# Patient Record
Sex: Female | Born: 1980 | Race: White | Hispanic: No | Marital: Single | Smoking: Current every day smoker
Health system: Southern US, Community
[De-identification: ages and names within clinical notes are randomized; demographics above are authoritative.]

## PROBLEM LIST (undated history)

## (undated) HISTORY — PX: BREAST SURGERY: SHX581

## (undated) HISTORY — PX: TUBAL LIGATION: SHX77

---

## 2012-10-05 ENCOUNTER — Emergency Department (HOSPITAL_COMMUNITY): Payer: Medicaid Other

## 2012-10-05 ENCOUNTER — Emergency Department (HOSPITAL_COMMUNITY)
Admission: EM | Admit: 2012-10-05 | Discharge: 2012-10-05 | Disposition: A | Discharge status: Home / Self Care | Payer: Medicaid Other | Attending: Emergency Medicine | Admitting: Emergency Medicine

## 2012-10-05 ENCOUNTER — Encounter (HOSPITAL_COMMUNITY): Payer: Self-pay | Admitting: Emergency Medicine

## 2012-10-05 DIAGNOSIS — S0993XA Unspecified injury of face, initial encounter: Secondary | ICD-10-CM | POA: Insufficient documentation

## 2012-10-05 DIAGNOSIS — F17200 Nicotine dependence, unspecified, uncomplicated: Secondary | ICD-10-CM | POA: Insufficient documentation

## 2012-10-05 DIAGNOSIS — S0990XA Unspecified injury of head, initial encounter: Secondary | ICD-10-CM

## 2012-10-05 DIAGNOSIS — S060X9A Concussion with loss of consciousness of unspecified duration, initial encounter: Secondary | ICD-10-CM | POA: Insufficient documentation

## 2012-10-05 MED ORDER — OXYCODONE-ACETAMINOPHEN 5-325 MG PO TABS: 1.0000 | ORAL_TABLET | Freq: Once | ORAL | Status: DC

## 2012-10-05 MED ORDER — OXYCODONE-ACETAMINOPHEN 5-325 MG PO TABS: 2.0000 | ORAL_TABLET | Freq: Four times a day (QID) | ORAL | Status: AC | PRN

## 2012-10-05 NOTE — ED Notes (Signed)
Percocet given by previous [employer] fundraiser

## 2012-10-05 NOTE — ED Notes (Signed)
[employer] spoke with pt. at waiting area.
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
## 2014-04-09 ENCOUNTER — Encounter (HOSPITAL_COMMUNITY): Payer: Self-pay | Admitting: Emergency Medicine

## 2014-04-09 ENCOUNTER — Emergency Department (HOSPITAL_COMMUNITY): Payer: Self-pay

## 2014-04-09 ENCOUNTER — Emergency Department (HOSPITAL_COMMUNITY)
Admission: EM | Admit: 2014-04-09 | Discharge: 2014-04-09 | Disposition: A | Discharge status: Home / Self Care | Payer: Self-pay | Attending: Emergency Medicine | Admitting: Emergency Medicine

## 2014-04-09 DIAGNOSIS — R059 Cough, unspecified: Secondary | ICD-10-CM

## 2014-04-09 DIAGNOSIS — Z720 Tobacco use: Secondary | ICD-10-CM | POA: Insufficient documentation

## 2014-04-09 DIAGNOSIS — R6889 Other general symptoms and signs: Secondary | ICD-10-CM

## 2014-04-09 DIAGNOSIS — R05 Cough: Secondary | ICD-10-CM | POA: Insufficient documentation

## 2014-04-09 DIAGNOSIS — Z3202 Encounter for pregnancy test, result negative: Secondary | ICD-10-CM | POA: Insufficient documentation

## 2014-04-09 LAB — CBC WITH DIFFERENTIAL/PLATELET
BASOS ABS: 0 10*3/uL (ref 0.0–0.1)
Basophils Relative: 0 % (ref 0–1)
EOS PCT: 0 % (ref 0–5)
Eosinophils Absolute: 0 10*3/uL (ref 0.0–0.7)
HEMATOCRIT: 45.3 % (ref 36.0–46.0)
HEMOGLOBIN: 15.5 g/dL — AB (ref 12.0–15.0)
LYMPHS ABS: 0.8 10*3/uL (ref 0.7–4.0)
LYMPHS PCT: 10 % — AB (ref 12–46)
MCH: 34.4 pg — AB (ref 26.0–34.0)
MCHC: 34.2 g/dL (ref 30.0–36.0)
MCV: 100.4 fL — AB (ref 78.0–100.0)
MONO ABS: 1.2 10*3/uL — AB (ref 0.1–1.0)
Monocytes Relative: 15 % — ABNORMAL HIGH (ref 3–12)
NEUTROS ABS: 5.9 10*3/uL (ref 1.7–7.7)
Neutrophils Relative %: 75 % (ref 43–77)
Platelets: 124 10*3/uL — ABNORMAL LOW (ref 150–400)
RBC: 4.51 MIL/uL (ref 3.87–5.11)
RDW: 12.7 % (ref 11.5–15.5)
WBC: 7.9 10*3/uL (ref 4.0–10.5)

## 2014-04-09 LAB — COMPREHENSIVE METABOLIC PANEL
ALBUMIN: 4 g/dL (ref 3.5–5.2)
ALT: 77 U/L — ABNORMAL HIGH (ref 0–35)
ANION GAP: 8 (ref 5–15)
AST: 78 U/L — ABNORMAL HIGH (ref 0–37)
Alkaline Phosphatase: 70 U/L (ref 39–117)
BILIRUBIN TOTAL: 0.6 mg/dL (ref 0.3–1.2)
BUN: 13 mg/dL (ref 6–23)
CO2: 24 mmol/L (ref 19–32)
Calcium: 9 mg/dL (ref 8.4–10.5)
Chloride: 106 mmol/L (ref 96–112)
Creatinine, Ser: 1.03 mg/dL (ref 0.50–1.10)
GFR calc non Af Amer: 72 mL/min — ABNORMAL LOW (ref >90)
GFR, EST AFRICAN AMERICAN: 83 mL/min — AB (ref >90)
GLUCOSE: 137 mg/dL — AB (ref 70–99)
Potassium: 3.6 mmol/L (ref 3.5–5.1)
SODIUM: 138 mmol/L (ref 135–145)
Total Protein: 7.5 g/dL (ref 6.0–8.3)

## 2014-04-09 LAB — URINALYSIS, ROUTINE W REFLEX MICROSCOPIC
GLUCOSE, UA: NEGATIVE mg/dL
Hgb urine dipstick: NEGATIVE
LEUKOCYTES UA: NEGATIVE
Nitrite: NEGATIVE
PH: 5.5 (ref 5.0–8.0)
Protein, ur: 100 mg/dL — AB
Specific Gravity, Urine: >1.03 — ABNORMAL HIGH (ref 1.005–1.030)
Urobilinogen, UA: 0.2 mg/dL (ref 0.0–1.0)

## 2014-04-09 LAB — URINE MICROSCOPIC-ADD ON

## 2014-04-09 LAB — POC URINE PREG, ED: PREG TEST UR: NEGATIVE

## 2014-04-09 MED ORDER — ONDANSETRON HCL 4 MG/2ML IJ SOLN
4.0000 mg | Freq: Once | INTRAMUSCULAR | Status: AC
  Administered 2014-04-09: 4 mg via INTRAVENOUS
  Filled 2014-04-09: qty 2

## 2014-04-09 MED ORDER — KETOROLAC TROMETHAMINE 30 MG/ML IJ SOLN
30.0000 mg | Freq: Once | INTRAMUSCULAR | Status: AC
  Administered 2014-04-09: 30 mg via INTRAVENOUS
  Filled 2014-04-09: qty 1

## 2014-04-09 MED ORDER — ALBUTEROL SULFATE HFA 108 (90 BASE) MCG/ACT IN AERS: 2.0000 | INHALATION_SPRAY | RESPIRATORY_TRACT | Status: AC | PRN

## 2014-04-09 MED ORDER — SODIUM CHLORIDE 0.9 % IV BOLUS (SEPSIS)
1000.0000 mL | Freq: Once | INTRAVENOUS | Status: AC
  Administered 2014-04-09: 1000 mL via INTRAVENOUS

## 2014-04-09 MED ORDER — GUAIFENESIN-CODEINE 100-10 MG/5ML PO SOLN
10.0000 mL | Freq: Once | ORAL | Status: AC
  Administered 2014-04-09: 10 mL via ORAL
  Filled 2014-04-09: qty 10

## 2014-04-09 MED ORDER — GUAIFENESIN-CODEINE 100-10 MG/5ML PO SOLN: 10.0000 mL | Freq: Four times a day (QID) | ORAL | Status: AC | PRN

## 2014-04-09 MED ORDER — PROMETHAZINE HCL 25 MG PO TABS: 25.0000 mg | ORAL_TABLET | Freq: Four times a day (QID) | ORAL | Status: AC | PRN

## 2014-04-09 MED ORDER — IBUPROFEN 800 MG PO TABS: 800.0000 mg | ORAL_TABLET | Freq: Three times a day (TID) | ORAL | Status: AC | PRN

## 2014-04-09 MED ORDER — ACETAMINOPHEN 500 MG PO TABS
1000.0000 mg | ORAL_TABLET | Freq: Once | ORAL | Status: AC
  Administered 2014-04-09: 1000 mg via ORAL
  Filled 2014-04-09: qty 2

## 2014-04-09 NOTE — ED Provider Notes (Signed)
TIME SEEN: 5:45 AM  CHIEF COMPLAINT: Flulike symptoms  HPI: Pt is a 34 y.o. female with no significant past medical history who presents to the emergency department with complaints of 2-3 days worth of dry cough, wheezing, sore throat, headache, subjective fevers, bodyaches, vomiting. Denies diarrhea. No rash. No dysuria or hematuria. Patient's significant other has similar symptoms. Patient did not have an influenza vaccination. No travel.  ROS: See HPI Constitutional:  fever  Eyes: no drainage  ENT: no runny nose   Cardiovascular:  no chest pain  Resp:  SOB  GI:  vomiting GU: no dysuria Integumentary: no rash  Allergy: no hives  Musculoskeletal: no leg swelling  Neurological: no slurred speech ROS otherwise negative  PAST MEDICAL HISTORY/PAST SURGICAL HISTORY:  History reviewed. No pertinent past medical history.  MEDICATIONS:  Prior to Admission medications   Medication Sig Start Date End Date Taking? Authorizing Provider  oxyCODONE-acetaminophen (PERCOCET/ROXICET) 5-325 MG per tablet Take 2 tablets by mouth every 6 (six) hours as needed for pain. 10/05/12   [name], DO    ALLERGIES:  No Known Allergies  SOCIAL HISTORY:  History  Substance Use Topics  . Smoking status: Current Every Day Smoker  . Smokeless tobacco: Not on file  . Alcohol Use: Yes    FAMILY HISTORY: History reviewed. No pertinent family history.  EXAM: BP 108/64 mmHg  Pulse 110  Temp(Src) 100.1 F (37.8 C) (Oral)  Resp 18  SpO2 100%  LMP 04/08/2014 CONSTITUTIONAL: Alert and oriented and responds appropriately to questions. Well-appearing; well-nourished, appears uncomfortable but is nontoxic HEAD: Normocephalic EYES: Conjunctivae clear, PERRL ENT: normal nose; no rhinorrhea; moist mucous membranes; patient has pharyngeal erythema without tonsillar hypertrophy or exudate, no uvular deviation, no trismus or drooling, no sign of dental abscess, normal phonation NECK: Supple, no meningismus,  no LAD  CARD: Regular and tachycardic; S1 and S2 appreciated; no murmurs, no clicks, no rubs, no gallops RESP: Normal chest excursion without splinting or tachypnea; breath sounds clear and equal bilaterally; no wheezes, no rhonchi, no rales, no hypoxia or respiratory distress, speaking full sentences, good aeration diffusely ABD/GI: Normal bowel sounds; non-distended; soft, non-tender, no rebound, no guarding BACK:  The back appears normal and is non-tender to palpation, there is no CVA tenderness EXT: Normal ROM in all joints; non-tender to palpation; no edema; normal capillary refill; no cyanosis    SKIN: Normal color for age and race; warm, no rash NEURO: Moves all extremities equally PSYCH: The patient's mood and manner are appropriate. Grooming and personal hygiene are appropriate.  MEDICAL DECISION MAKING: Patient here with flulike symptoms. Obtain basic labs, urinalysis given patient is reporting shortness of breath and multiple episodes of nonbloody, nonbilious vomiting. We'll give IV fluids, Toradol, Zofran, guaifenesin with codeine. Will obtain chest x-ray.  ED PROGRESS: Patient's labs showed mild elevation of her AST and ALT but otherwise normal LFTs. No right upper quadrant tenderness on exam, negative Murphy sign. Urine shows trace ketones, protein, many bacteria but no other sign of infection. Urine pregnancy point of care was negative. Confirmed with Gaspar Garbe, NT.    Chest x-ray clear. Vital signs improved.  Patient is tolerated by mouth. We'll discharge home with prescription for ibuprofen, albuterol inhaler, guaifenesin with codeine, Phenergan. Patient is outside the 48-hour window for Tamiflu. Discussed return precautions. Discussed with patient that antibiotics will not help her symptoms and this is a virus that may last 1-2 weeks. Have advised her to alternate Tylenol and Motrin. Discussed with patient that management  is supportive treatment at home. She verbalized understanding and is  comfortable with plan.     Tina MawKristen N Ward, DO 04/09/14 [phone]

## 2014-04-09 NOTE — ED Notes (Signed)
Pt has multiple complaints such as sore throat, vomiting, cough, fever and sob.

## 2014-04-09 NOTE — Discharge Instructions (Signed)
Influenza °Influenza ("the flu") is a viral infection of the respiratory tract. It occurs more often in winter months because people spend more time in close contact with one another. Influenza can make you feel very sick. Influenza easily spreads from person to person (contagious). °CAUSES  °Influenza is caused by a virus that infects the respiratory tract. You can catch the virus by breathing in droplets from an infected person's cough or sneeze. You can also catch the virus by touching something that was recently contaminated with the virus and then touching your mouth, nose, or eyes. °RISKS AND COMPLICATIONS °You may be at risk for a more severe case of influenza if you smoke cigarettes, have diabetes, have chronic heart disease (such as heart failure) or lung disease (such as asthma), or if you have a weakened immune system. Elderly people and pregnant women are also at risk for more serious infections. The most common problem of influenza is a lung infection (pneumonia). Sometimes, this problem can require emergency medical care and may be life threatening. °SIGNS AND SYMPTOMS  °Symptoms typically last 4 to 10 days and may include: °· Fever. °· Chills. °· Headache, body aches, and muscle aches. °· Sore throat. °· Chest discomfort and cough. °· Poor appetite. °· Weakness or feeling tired. °· Dizziness. °· Nausea or vomiting. °DIAGNOSIS  °Diagnosis of influenza is often made based on your history and a physical exam. A nose or throat swab test can be done to confirm the diagnosis. °TREATMENT  °In mild cases, influenza goes away on its own. Treatment is directed at relieving symptoms. For more severe cases, your health care provider may prescribe antiviral medicines to shorten the sickness. Antibiotic medicines are not effective because the infection is caused by a virus, not by bacteria. °HOME CARE INSTRUCTIONS °· Take medicines only as directed by your health care provider. °· Use a cool mist humidifier to make  breathing easier. °· Get plenty of rest until your temperature returns to normal. This usually takes 3 to 4 days. °· Drink enough fluid to keep your urine clear or pale yellow. °· Cover your mouth and nose when coughing or sneezing, and wash your hands well to prevent the virus from spreading. °· Stay home from work or school until the fever is gone for at least 1 full day. °PREVENTION  °An annual influenza vaccination (flu shot) is the best way to avoid getting influenza. An annual flu shot is now routinely recommended for all adults in the [place] °SEEK MEDICAL CARE IF: °· You experience chest pain, your cough worsens, or you produce more mucus. °· You have nausea, vomiting, or diarrhea. °· Your fever returns or gets worse. °SEEK IMMEDIATE MEDICAL CARE IF: °· You have trouble breathing, you become short of breath, or your skin or nails become bluish. °· You have severe pain or stiffness in the neck. °· You develop a sudden headache, or pain in the face or ear. °· You have nausea or vomiting that you cannot control. °MAKE SURE YOU:  °· Understand these instructions. °· Will watch your condition. °· Will get help right away if you are not doing well or get worse. °Document Released: 01/15/2000 Document Revised: 06/03/2013 Document Reviewed: 04/18/2011 °[employer]® Patient Information ©2015 [employer]. This information is not intended to replace advice given to you by your health care provider. Make sure you discuss any questions you have with your health care provider. ° °Viral Infections °A viral infection can be caused by different types of viruses. Most viral infections are not serious and resolve on their own. However, some infections may cause severe symptoms and may lead to further complications. °SYMPTOMS °Viruses can frequently   cause: °· Minor sore throat. °· Aches and pains. °· Headaches. °· Runny nose. °· Different types of rashes. °· Watery eyes. °· Tiredness. °· Cough. °· Loss of  appetite. °· Gastrointestinal infections, resulting in nausea, vomiting, and diarrhea. °These symptoms do not respond to antibiotics because the infection is not caused by bacteria. However, you might catch a bacterial infection following the viral infection. This is sometimes called a "superinfection." Symptoms of such a bacterial infection may include: °· Worsening sore throat with pus and difficulty swallowing. °· Swollen neck glands. °· Chills and a high or persistent fever. °· Severe headache. °· Tenderness over the sinuses. °· Persistent overall ill feeling (malaise), muscle aches, and tiredness (fatigue). °· Persistent cough. °· Yellow, green, or brown mucus production with coughing. °HOME CARE INSTRUCTIONS  °· Only take over-the-counter or prescription medicines for pain, discomfort, diarrhea, or fever as directed by your caregiver. °· Drink enough water and fluids to keep your urine clear or pale yellow. Sports drinks can provide valuable electrolytes, sugars, and hydration. °· Get plenty of rest and maintain proper nutrition. Soups and broths with crackers or rice are fine. °SEEK IMMEDIATE MEDICAL CARE IF:  °· You have severe headaches, shortness of breath, chest pain, neck pain, or an unusual rash. °· You have uncontrolled vomiting, diarrhea, or you are unable to keep down fluids. °· You or your child has an oral temperature above 102° F (38.9° C), not controlled by medicine. °· Your baby is older than 3 months with a rectal temperature of 102° F (38.9° C) or higher. °· Your baby is 3 months old or younger with a rectal temperature of 100.4° F (38° C) or higher. °MAKE SURE YOU:  °· Understand these instructions. °· Will watch your condition. °· Will get help right away if you are not doing well or get worse. °Document Released: 10/27/2004 Document Revised: 04/11/2011 Document Reviewed: 05/24/2010 °ExitCare® Patient Information ©2015 [employer]. This information is not intended to replace advice given  to you by your health care provider. Make sure you discuss any questions you have with your health care provider. ° °

## 2017-12-24 ENCOUNTER — Encounter (HOSPITAL_COMMUNITY): Payer: Self-pay

## 2017-12-24 ENCOUNTER — Emergency Department (HOSPITAL_COMMUNITY): Payer: Self-pay

## 2017-12-24 ENCOUNTER — Other Ambulatory Visit: Payer: Self-pay

## 2017-12-24 ENCOUNTER — Emergency Department (HOSPITAL_COMMUNITY)
Admission: EM | Admit: 2017-12-24 | Discharge: 2017-12-24 | Disposition: A | Discharge status: Home / Self Care | Payer: Self-pay | Attending: Emergency Medicine | Admitting: Emergency Medicine

## 2017-12-24 DIAGNOSIS — W102XXA Fall (on)(from) incline, initial encounter: Secondary | ICD-10-CM | POA: Insufficient documentation

## 2017-12-24 DIAGNOSIS — Y9301 Activity, walking, marching and hiking: Secondary | ICD-10-CM | POA: Insufficient documentation

## 2017-12-24 DIAGNOSIS — W19XXXA Unspecified fall, initial encounter: Secondary | ICD-10-CM

## 2017-12-24 DIAGNOSIS — S53124A Posterior dislocation of right ulnohumeral joint, initial encounter: Secondary | ICD-10-CM | POA: Insufficient documentation

## 2017-12-24 DIAGNOSIS — F17200 Nicotine dependence, unspecified, uncomplicated: Secondary | ICD-10-CM | POA: Insufficient documentation

## 2017-12-24 DIAGNOSIS — Y929 Unspecified place or not applicable: Secondary | ICD-10-CM | POA: Insufficient documentation

## 2017-12-24 DIAGNOSIS — Y999 Unspecified external cause status: Secondary | ICD-10-CM | POA: Insufficient documentation

## 2017-12-24 MED ORDER — ACETAMINOPHEN 500 MG PO TABS: 1000.0000 mg | ORAL_TABLET | Freq: Three times a day (TID) | ORAL | 0 refills | Status: AC

## 2017-12-24 MED ORDER — DIAZEPAM 5 MG/ML IJ SOLN
2.5000 mg | Freq: Once | INTRAMUSCULAR | Status: AC
  Administered 2017-12-24: 2.5 mg via INTRAVENOUS
  Filled 2017-12-24: qty 2

## 2017-12-24 MED ORDER — SODIUM CHLORIDE 0.9 % IV BOLUS
1000.0000 mL | Freq: Once | INTRAVENOUS | Status: AC
  Administered 2017-12-24: 1000 mL via INTRAVENOUS

## 2017-12-24 MED ORDER — KETAMINE HCL 50 MG/5ML IJ SOSY
1.0000 mg/kg | PREFILLED_SYRINGE | Freq: Once | INTRAMUSCULAR | Status: DC
  Filled 2017-12-24: qty 10

## 2017-12-24 MED ORDER — PROPOFOL 10 MG/ML IV BOLUS
40.0000 mg | Freq: Once | INTRAVENOUS | Status: DC
  Filled 2017-12-24: qty 20

## 2017-12-24 MED ORDER — PROPOFOL 10 MG/ML IV BOLUS
INTRAVENOUS | Status: AC | PRN
  Administered 2017-12-24: 20 mg via INTRAVENOUS
  Administered 2017-12-24: 40 mg via INTRAVENOUS
  Administered 2017-12-24: 20 mg via INTRAVENOUS

## 2017-12-24 MED ORDER — HYDROCODONE-ACETAMINOPHEN 5-325 MG PO TABS: 1.0000 | ORAL_TABLET | Freq: Three times a day (TID) | ORAL | 0 refills | Status: AC | PRN

## 2017-12-24 MED ORDER — HYDROMORPHONE HCL 1 MG/ML IJ SOLN
1.0000 mg | Freq: Once | INTRAMUSCULAR | Status: AC
  Administered 2017-12-24: 1 mg via INTRAVENOUS
  Filled 2017-12-24: qty 1

## 2017-12-24 MED ORDER — KETAMINE HCL 10 MG/ML IJ SOLN
INTRAMUSCULAR | Status: AC | PRN
  Administered 2017-12-24: 40 mg via INTRAVENOUS

## 2017-12-24 MED ORDER — HYDROMORPHONE HCL 1 MG/ML IJ SOLN
0.5000 mg | Freq: Once | INTRAMUSCULAR | Status: AC
  Administered 2017-12-24: 0.5 mg via INTRAVENOUS
  Filled 2017-12-24: qty 1

## 2017-12-24 MED ORDER — KETAMINE HCL 10 MG/ML IJ SOLN
INTRAMUSCULAR | Status: AC | PRN
  Administered 2017-12-24: 20 mg via INTRAVENOUS

## 2017-12-24 NOTE — ED Provider Notes (Signed)
[facility] EMERGENCY DEPARTMENT Provider Note  CSN: [id number] Arrival date & time: 12/24/17 4098  Chief Complaint(s) Fall  HPI Tina Quinn is a 37 y.o. female who presents with right arm pain following a mechanical fall.  Patient reports that she was walking up a wet wooden ramp in the rain, slipped and fell on a metal ramp onto her right arm.  Patient reports that she felt her right arm buckle and bent backwards.  Patient felt immediate severe pain that is exacerbated with movement and palpation of the right upper extremity.  She denied any other injuries.  Denies any headache, neck pain, back pain, chest pain,  abdominal pain.  No hip pain or lower extremity pain.  HPI  Past Medical History History reviewed. No pertinent past medical history. There are no active problems to display for this patient.  Home Medication(s) Prior to Admission medications   Medication Sig Start Date End Date Taking? Authorizing Provider  albuterol (PROVENTIL HFA;VENTOLIN HFA) 108 (90 BASE) MCG/ACT inhaler Inhale 2 puffs into the lungs every 4 (four) hours as needed for wheezing or shortness of breath. 04/09/14  Yes Ward, Layla Maw, DO  acetaminophen (TYLENOL) 500 MG tablet Take 2 tablets (1,000 mg total) by mouth every 8 (eight) hours for 5 days. Do not take more than 4000 mg of acetaminophen (Tylenol) in a 24-hour period. Please note that other medicines that you may be prescribed may have Tylenol as well. 12/24/17 12/29/17  Nira Conn, MD  guaiFENesin-codeine 100-10 MG/5ML syrup Take 10 mLs by mouth every 6 (six) hours as needed for cough. Patient not taking: Reported on 12/24/2017 04/09/14   Ward, Layla Maw, DO  HYDROcodone-acetaminophen (NORCO/VICODIN) 5-325 MG tablet Take 1 tablet by mouth every 8 (eight) hours as needed for up to 5 days for severe pain (That is not improved by your scheduled acetaminophen regimen). Please do not exceed 4000 mg of acetaminophen (Tylenol) a  24-hour period. Please note that he may be prescribed additional medicine that contains acetaminophen. 12/24/17 12/29/17  Nira Conn, MD  ibuprofen (ADVIL,MOTRIN) 800 MG tablet Take 1 tablet (800 mg total) by mouth every 8 (eight) hours as needed for mild pain. Patient not taking: Reported on 12/24/2017 04/09/14   Ward, Layla Maw, DO  oxyCODONE-acetaminophen (PERCOCET/ROXICET) 5-325 MG per tablet Take 2 tablets by mouth every 6 (six) hours as needed for pain. Patient not taking: Reported on 12/24/2017 10/05/12   Ward, Layla Maw, DO  promethazine (PHENERGAN) 25 MG tablet Take 1 tablet (25 mg total) by mouth every 6 (six) hours as needed for nausea or vomiting. Patient not taking: Reported on 12/24/2017 04/09/14   Ward, Layla Maw, DO                                                                                                                                    Past Surgical History Past Surgical History:  Procedure Laterality  Date  . BREAST SURGERY    . TUBAL LIGATION     Family History No family history on file.  Social History Social History   Tobacco Use  . Smoking status: Current Every Day Smoker  Substance Use Topics  . Alcohol use: Yes  . Drug use: No   Allergies Patient has no known allergies.  Review of Systems Review of Systems All other systems are reviewed and are negative for acute change except as noted in the HPI  Physical Exam Vital Signs  I have reviewed the triage vital signs BP 126/74   Pulse 74   Temp (!) 97.2 F (36.2 C) (Oral)   Resp 14   Ht 5\' 11"  (1.803 m)   Wt 74.8 kg   LMP 12/10/2017   SpO2 100%   BMI 23.01 kg/m   Physical Exam  Constitutional: She is oriented to person, place, and time. She appears well-developed and well-nourished. No distress.  HENT:  Head: Normocephalic and atraumatic.  Right Ear: External ear normal.  Left Ear: External ear normal.  Nose: Nose normal.  Eyes: Pupils are equal, round, and reactive to light.  Conjunctivae and EOM are normal. Right eye exhibits no discharge. Left eye exhibits no discharge. No scleral icterus.  Neck: Normal range of motion. Neck supple.  Cardiovascular: Normal rate, regular rhythm and normal heart sounds. Exam reveals no gallop and no friction rub.  No murmur heard. Pulses:      Radial pulses are 2+ on the right side, and 2+ on the left side.       Dorsalis pedis pulses are 2+ on the right side, and 2+ on the left side.  Pulmonary/Chest: Effort normal and breath sounds normal. No stridor. No respiratory distress. She has no wheezes.  Abdominal: Soft. She exhibits no distension. There is no tenderness.  Musculoskeletal: She exhibits no edema.       Right elbow: She exhibits decreased range of motion, swelling and deformity. She exhibits no laceration. Tenderness found.       Cervical back: She exhibits no bony tenderness.       Thoracic back: She exhibits no bony tenderness.       Lumbar back: She exhibits no bony tenderness.       Right upper arm: She exhibits tenderness and bony tenderness.       Right forearm: She exhibits tenderness. She exhibits no edema and no deformity.  Clavicles stable. Chest stable to AP/Lat compression. Pelvis stable to Lat compression. No obvious extremity deformity. No chest or abdominal wall contusion.  Neurological: She is alert and oriented to person, place, and time. She has normal strength. No sensory deficit.  Skin: Skin is warm and dry. No rash noted. She is not diaphoretic. No erythema.  Psychiatric: She has a normal mood and affect.    ED Results and Treatments Labs (all labs ordered are listed, but only abnormal results are displayed) Labs Reviewed - No data to display  EKG  EKG Interpretation  Date/Time:    Ventricular Rate:    PR Interval:    QRS Duration:   QT Interval:    QTC Calculation:   R  Axis:     Text Interpretation:        Radiology Dg Elbow 2 Views Right  Result Date: 12/24/2017 CLINICAL DATA:  Post reduction EXAM: RIGHT ELBOW - 2 VIEW COMPARISON:  12/24/2017 FINDINGS: Limited two-view exam. Overlying cast material noted. There is improved alignment of the right elbow joint following reduction. On the oblique lateral view, there is a small ossified fragment along the coronoid process of the ulna. This is suspicious for a small displaced fracture. IMPRESSION: Improved alignment following reduction. Suspect small avulsion fracture in the region of the coronoid process of the ulna. Electronically Signed   By: Judie Petit.  Shick M.D.   On: 12/24/2017 08:21   Dg Elbow Complete Right (3+view)  Result Date: 12/24/2017 CLINICAL DATA:  Right elbow pain and deformity after a fall. EXAM: RIGHT ELBOW - COMPLETE 3+ VIEW COMPARISON:  None. FINDINGS: Posterior dislocation of the right elbow with complete posterior displacement of the radius and ulna with respect to the humerus. No fractures are demonstrated. Soft tissues are unremarkable. IMPRESSION: Posterior dislocation of the right elbow. Electronically Signed   By: Burman Nieves M.D.   On: 12/24/2017 05:57   Dg Forearm Right  Result Date: 12/24/2017 CLINICAL DATA:  Right elbow pain after a fall. EXAM: RIGHT FOREARM - 2 VIEW COMPARISON:  None. FINDINGS: Posterior dislocation of the right elbow. Right radius and ulna appear otherwise intact. No fractures identified. Soft tissues are unremarkable. IMPRESSION: Posterior dislocation of the right elbow. Electronically Signed   By: Burman Nieves M.D.   On: 12/24/2017 06:01   Dg Wrist Complete Right  Result Date: 12/24/2017 CLINICAL DATA:  Pain and deformity after a fall. EXAM: RIGHT WRIST - COMPLETE 3+ VIEW COMPARISON:  Right hand 02/01/2010 FINDINGS: There is no evidence of fracture or dislocation. There is no evidence of arthropathy or other focal bone abnormality. Soft tissues are  unremarkable. IMPRESSION: Negative. Electronically Signed   By: Burman Nieves M.D.   On: 12/24/2017 05:57   Dg Humerus Right  Result Date: 12/24/2017 CLINICAL DATA:  Pain and deformity after a fall. EXAM: RIGHT HUMERUS - 2+ VIEW COMPARISON:  None. FINDINGS: Posterior dislocation of the right also. The right humerus appears intact. Surgical clips in the right axilla. IMPRESSION: Posterior dislocation of the right elbow. No fracture of the humerus. Electronically Signed   By: Burman Nieves M.D.   On: 12/24/2017 05:58   Pertinent labs & imaging results that were available during my care of the patient were reviewed by me and considered in my medical decision making (see chart for details).  Medications Ordered in ED Medications  propofol (DIPRIVAN) 10 mg/mL bolus/IV push 40 mg (has no administration in time range)  ketamine 50 mg in normal saline 5 mL (10 mg/mL) syringe (has no administration in time range)  HYDROmorphone (DILAUDID) injection 1 mg (1 mg Intravenous Given 12/24/17 0501)  sodium chloride 0.9 % bolus 1,000 mL (1,000 mLs Intravenous New Bag/Given 12/24/17 0502)  diazepam (VALIUM) injection 2.5 mg (2.5 mg Intravenous Given 12/24/17 0501)  HYDROmorphone (DILAUDID) injection 0.5 mg (0.5 mg Intravenous Given 12/24/17 0654)  sodium chloride 0.9 % bolus 1,000 mL (1,000 mLs Intravenous New Bag/Given 12/24/17 0648)  ketamine (KETALAR) injection (40 mg Intravenous Given 12/24/17 0706)  propofol (DIPRIVAN) 10 mg/mL bolus/IV push (20 mg Intravenous  Given 12/24/17 0711)  ketamine (KETALAR) injection (20 mg Intravenous Given 12/24/17 0709)                                                                                                                                    Procedures .Sedation Date/Time: 12/24/2017 7:29 AM Performed by: Nira Conn, MD Authorized by: Nira Conn, MD   Consent:    Consent obtained:  Verbal   Consent given by:  Patient   Risks  discussed:  Allergic reaction, dysrhythmia, inadequate sedation, nausea, prolonged hypoxia resulting in organ damage, prolonged sedation necessitating reversal, respiratory compromise necessitating ventilatory assistance and intubation and vomiting   Alternatives discussed:  Analgesia without sedation, anxiolysis and regional anesthesia Universal protocol:    Procedure explained and questions answered to patient or proxy's satisfaction: yes     Relevant documents present and verified: yes     Test results available and properly labeled: yes     Imaging studies available: yes     Required blood products, implants, devices, and special equipment available: yes     Site/side marked: yes     Immediately prior to procedure a time out was called: yes     Patient identity confirmation method:  Verbally with patient Indications:    Procedure performed:  Dislocation reduction   Procedure necessitating sedation performed by:  Physician performing sedation Pre-sedation assessment:    Time since last food or drink:  2200   ASA classification: class 1 - normal, healthy patient     Neck mobility: normal     Mouth opening:  3 or more finger widths   Thyromental distance:  4 finger widths   Mallampati score:  I - soft palate, uvula, fauces, pillars visible   Pre-sedation assessments completed and reviewed: airway patency, cardiovascular function, hydration status, mental status, nausea/vomiting, pain level, respiratory function and temperature     Pre-sedation assessment completed:  12/24/2017 6:00 AM Immediate pre-procedure details:    Reassessment: Patient reassessed immediately prior to procedure     Reviewed: vital signs, relevant labs/tests and NPO status     Verified: bag valve mask available, emergency equipment available, intubation equipment available, IV patency confirmed, oxygen available and suction available   Procedure details (see MAR for exact dosages):    Preoxygenation:  Nasal cannula    Sedation:  Propofol and ketamine   Intra-procedure monitoring:  Blood pressure monitoring, cardiac monitor, continuous pulse oximetry, frequent LOC assessments, frequent vital sign checks and continuous capnometry   Intra-procedure events: none     Total Provider sedation time (minutes):  6 Post-procedure details:    Post-sedation assessment completed:  12/24/2017 7:29 AM   Attendance: Constant attendance by certified staff until patient recovered     Recovery: Patient returned to pre-procedure baseline     Post-sedation assessments completed and reviewed: airway patency, cardiovascular function, hydration status, mental status, nausea/vomiting, pain level, respiratory function and temperature  Patient is stable for discharge or admission: yes     Patient tolerance:  Tolerated well, no immediate complications .Ortho Injury Treatment Date/Time: 12/24/2017 7:30 AM Performed by: Nira Conn, MD Authorized by: Nira Conn, MD   Consent:    Consent obtained:  Verbal   Consent given by:  Patient   Risks discussed:  Recurrent dislocation, irreducible dislocation, nerve damage and fracture   Alternatives discussed:  Immobilization and alternative treatmentInjury location: elbow Location details: right elbow Injury type: dislocation Dislocation type: posterior Pre-procedure neurovascular assessment: neurovascularly intact  Patient sedated: Yes. Refer to sedation procedure documentation for details of sedation. Manipulation performed: yes Reduction method: traction and counter traction and flexion Reduction successful: yes X-ray confirmed reduction: yes Immobilization: splint and sling Splint type: long arm Supplies used: Ortho-Glass Post-procedure neurovascular assessment: post-procedure neurovascularly intact Patient tolerance: Patient tolerated the procedure well with no immediate complications     (including critical care time)  Medical Decision Making /  ED Course I have reviewed the nursing notes for this encounter and the patient's prior records (if available in EHR or on provided paperwork).    Mechanical fall resulting in right elbow dislocation.  No other injuries noted on exam or by imaging.  Patient provided with analgesia.  Elbow reduced under conscious sedation and splinted.  Postreduction film suspicious for small a avulsion fracture of the coracoid process of the ulna.  Ortho follow-up in 1 week.  The patient appears reasonably screened and/or stabilized for discharge and I doubt any other medical condition or other [facility] requiring further screening, evaluation, or treatment in the ED at this time prior to discharge.  The patient is safe for discharge with strict return precautions.   Final Clinical Impression(s) / ED Diagnoses Final diagnoses:  Fall  Closed posterior dislocation of right elbow, initial encounter    Disposition: Discharge  Condition: Good  I have discussed the results, Dx and Tx plan with the patient who expressed understanding and agree(s) with the plan. Discharge instructions discussed at great length. The patient was given strict return precautions who verbalized understanding of the instructions. No further questions at time of discharge.    ED Discharge Orders         Ordered    acetaminophen (TYLENOL) 500 MG tablet  Every 8 hours     12/24/17 0734    HYDROcodone-acetaminophen (NORCO/VICODIN) 5-325 MG tablet  Every 8 hours PRN     12/24/17 0734           Follow Up: Roby Lofts, MD [address] [place] [address] [phone]  Schedule an appointment as soon as possible for a visit  in 7-10 days for elbow dislocation     This chart was dictated using voice recognition software.  Despite best efforts to proofread,  errors can occur which can change the documentation meaning.   Nira Conn, MD 12/24/17 [phone]

## 2017-12-24 NOTE — ED Triage Notes (Signed)
Pt c/o left elbow pain after a fall down a ramp. Pt has deformity to left elbow. Pt did hit her head. Pt a&ox4. Pt unsure if she LOC.

## 2017-12-24 NOTE — ED Notes (Signed)
Patient verbalizes understanding of discharge instructions. Opportunity for questioning and answers were provided. Armband removed by staff, pt discharged from ED wheeled out and ride called.

## 2017-12-24 NOTE — Progress Notes (Signed)
Orthopedic Tech Progress Note Patient Details:  Tina Quinn 05/02/1980 [id number]  Ortho Devices Type of Ortho Device: Post (long leg) splint, Sling immobilizer Ortho Device/Splint Location: rue. applied post reduction. Ortho Device/Splint Interventions: Ordered, Application   Post Interventions Patient Tolerated: Well Instructions Provided: Care of device, Adjustment of device   Trinna PostMartinez, Dayelin Balducci J 12/24/2017, 7:38 AM

## 2018-10-19 ENCOUNTER — Telehealth: Payer: Self-pay

## 2018-10-19 NOTE — Telephone Encounter (Signed)
Opened in error. Wrong chart. °

## 2019-07-21 IMAGING — CR DG ELBOW 2V*R*
2 series · 2 of 2 positions shown · non-contrast
Comparison: 12/24/2017

CLINICAL DATA: Post reduction

EXAM:
RIGHT ELBOW - 2 VIEW

[elbow ap]
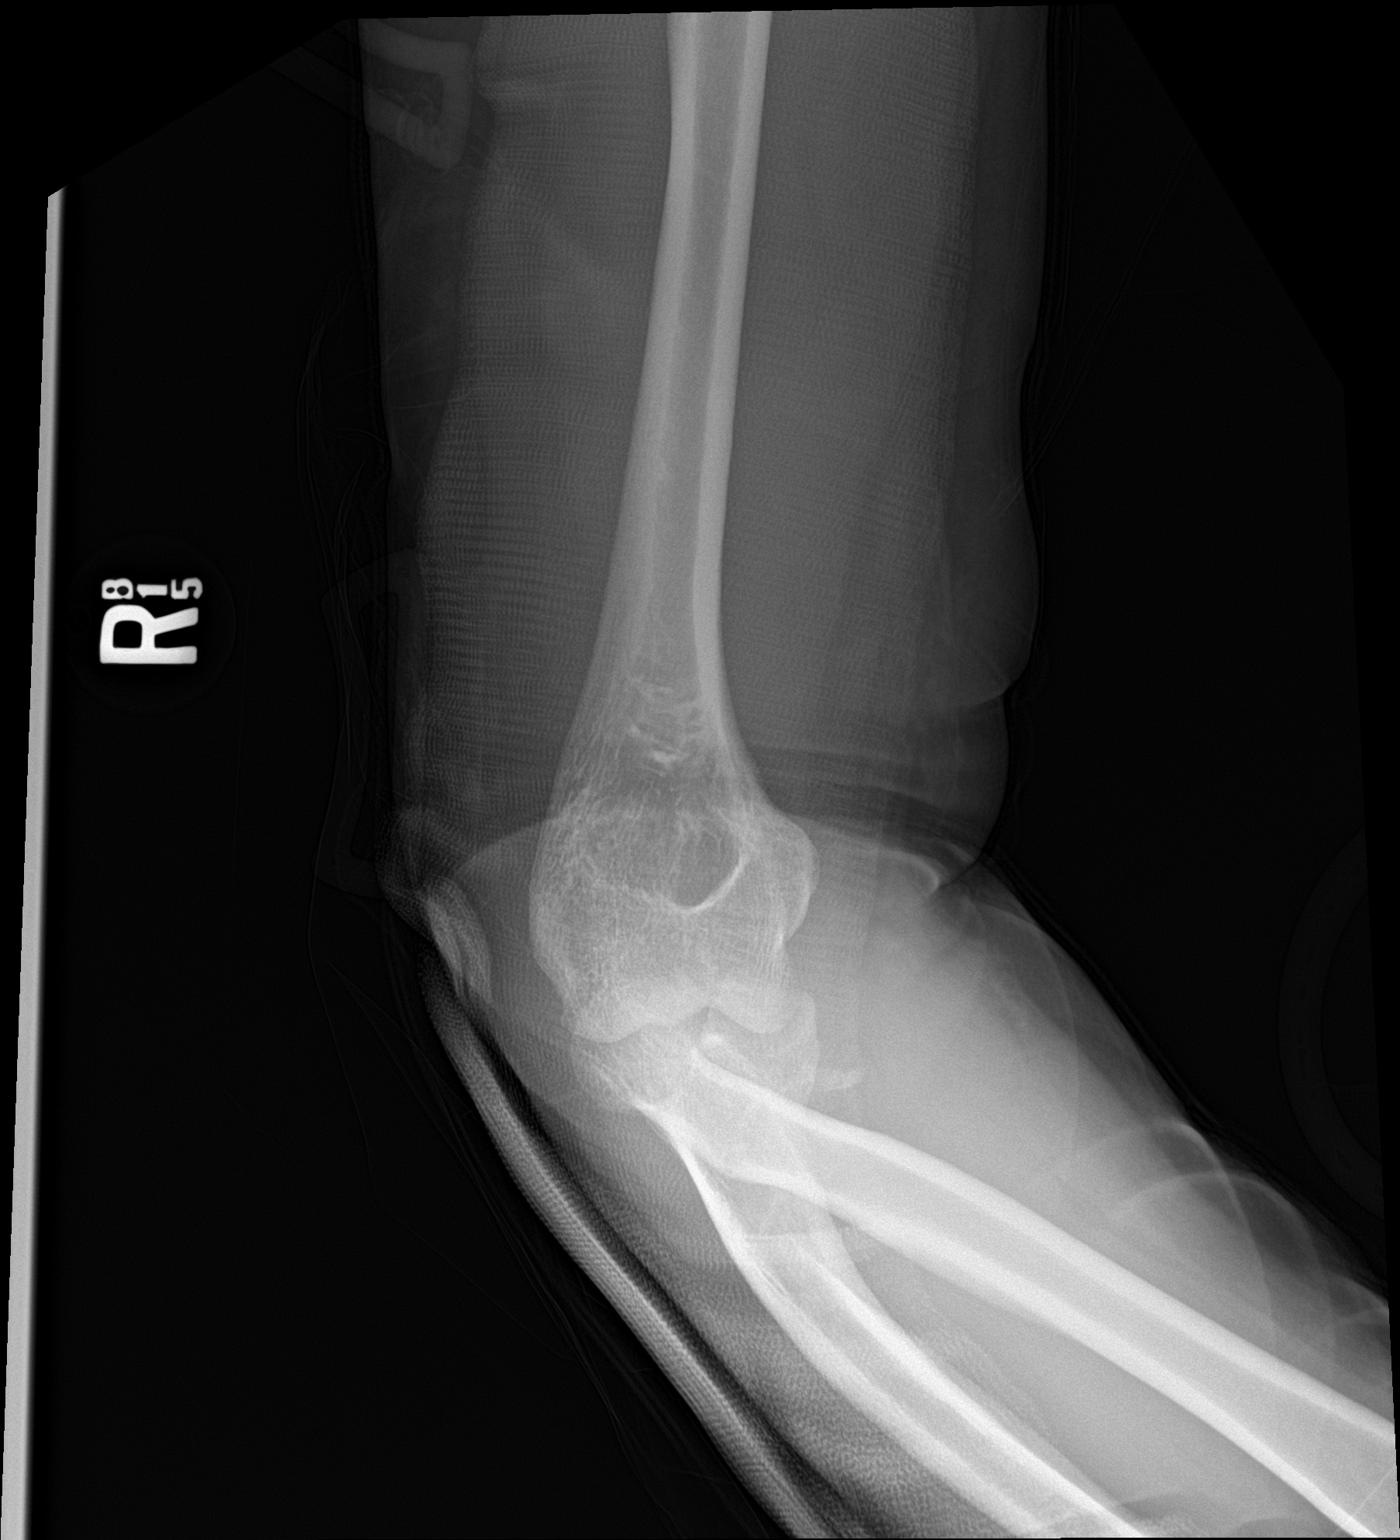

[elbow lat]
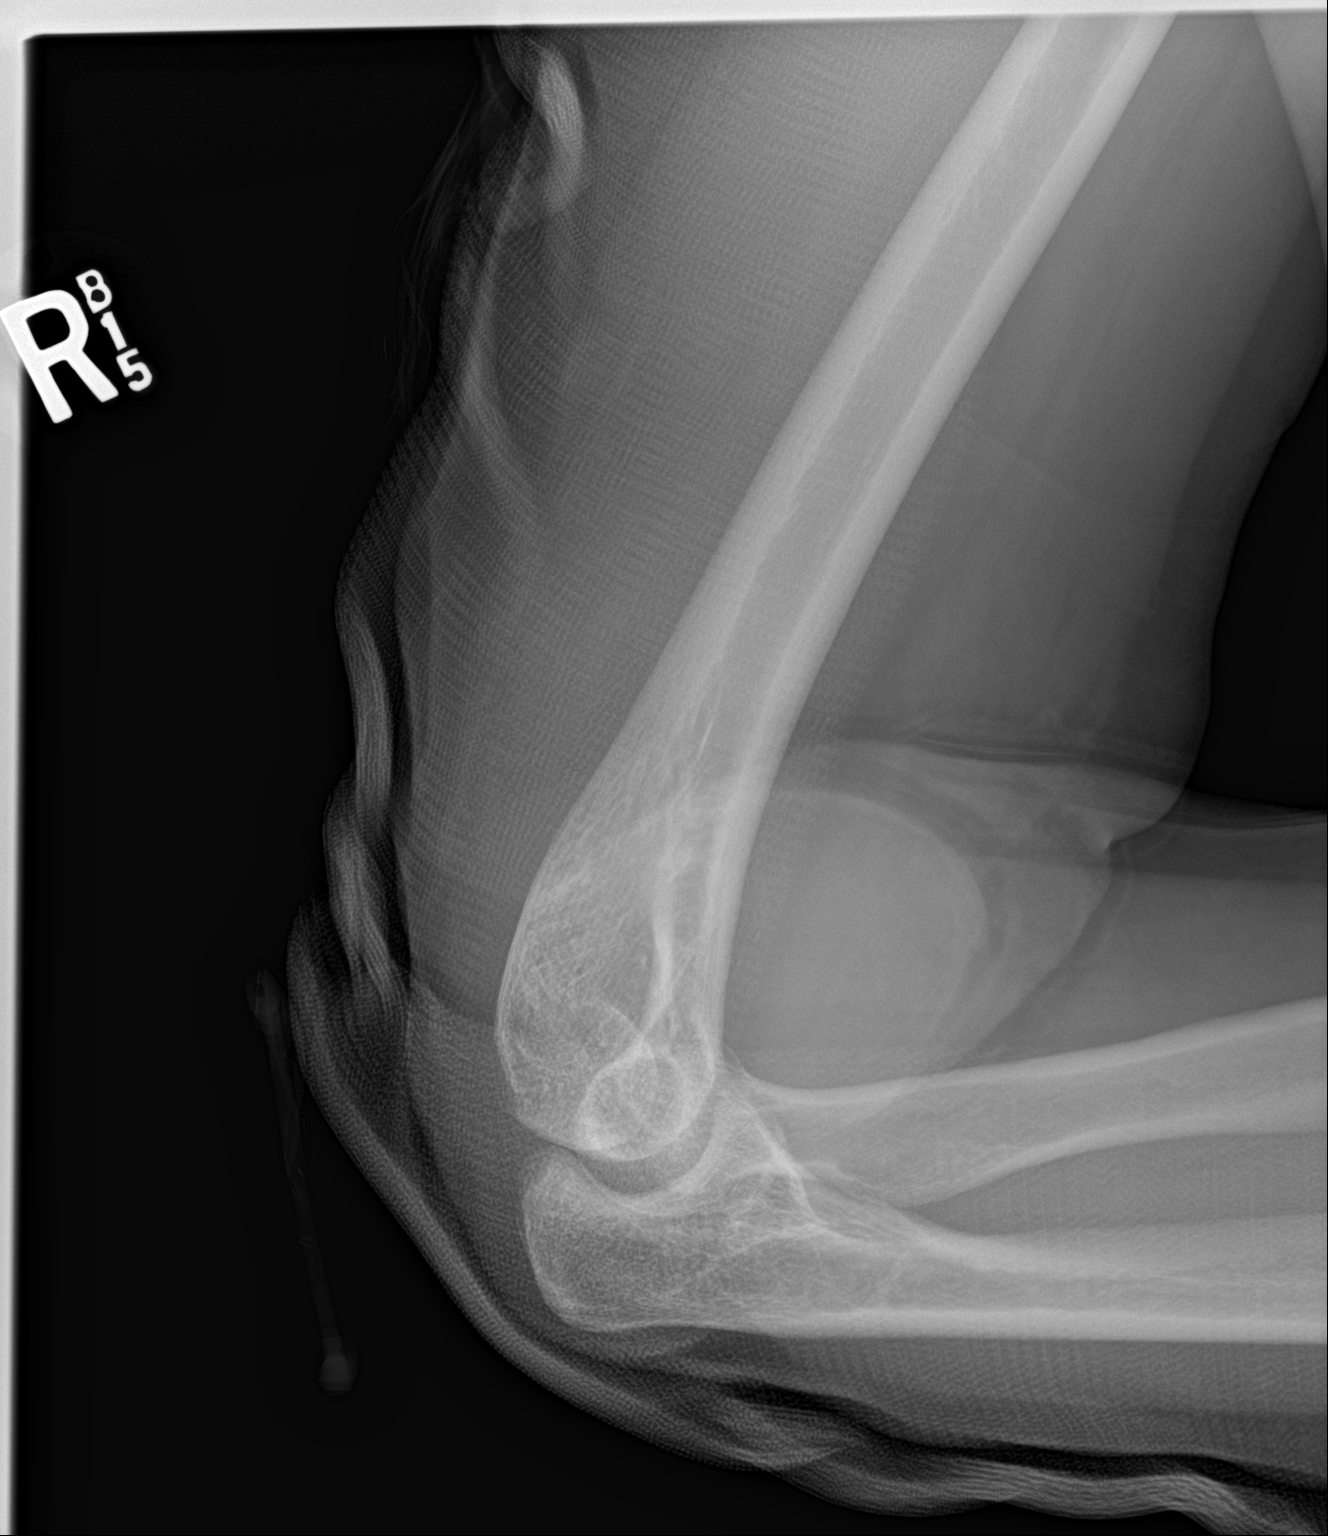

[2 of 2 positions shown; findings below may reference images not displayed]

FINDINGS: Limited two-view exam. Overlying cast material noted. There is
improved alignment of the right elbow joint following reduction. On
the oblique lateral view, there is a small ossified fragment along
the coronoid process of the ulna. This is suspicious for a small
displaced fracture.
IMPRESSION: Improved alignment following reduction.

Suspect small avulsion fracture in the region of the coronoid
process of the ulna.

## 2019-07-21 IMAGING — CR DG ELBOW COMPLETE 3+V*R*
3 series · 3 of 3 positions shown · non-contrast
Comparison: None.

CLINICAL DATA: Right elbow pain and deformity after a fall.

EXAM:
RIGHT ELBOW - COMPLETE 3+ VIEW

[elbow ap]
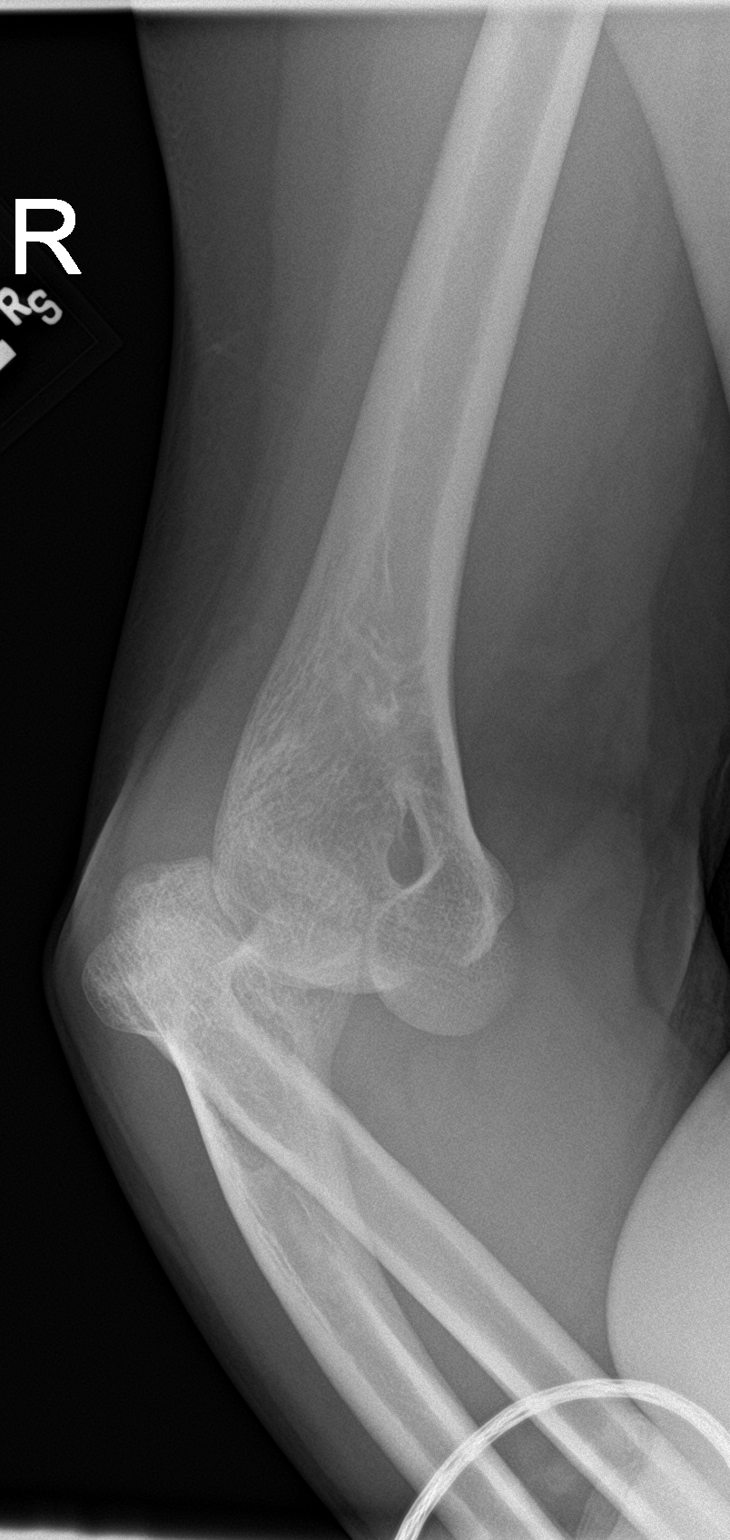

[elbow obl]
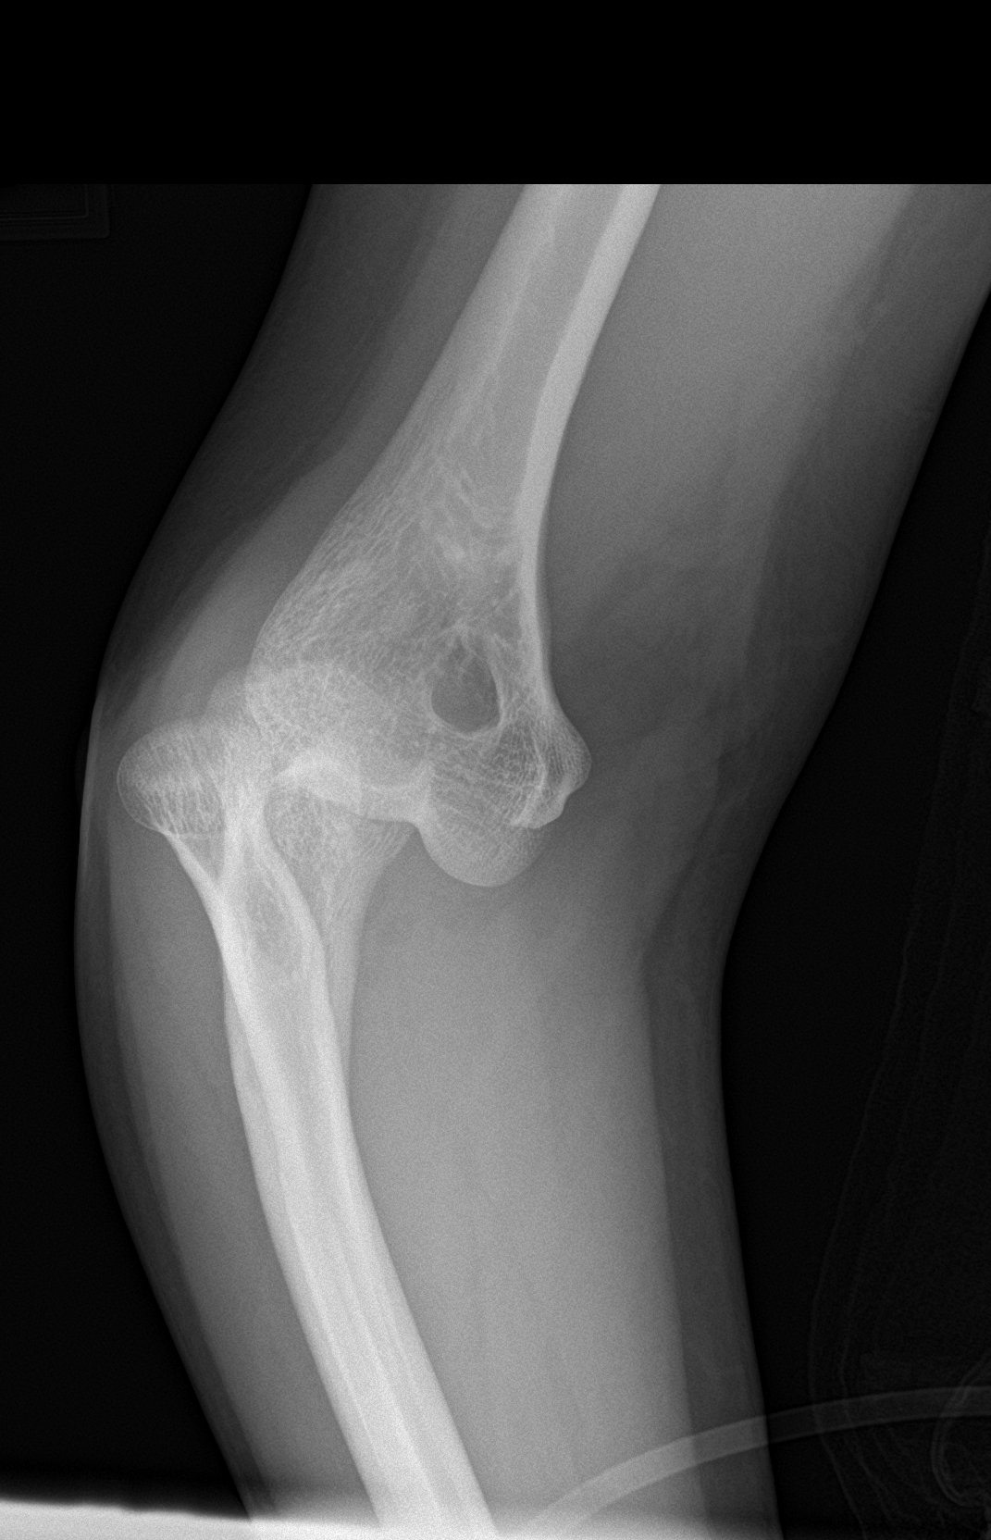

[elbow lat]
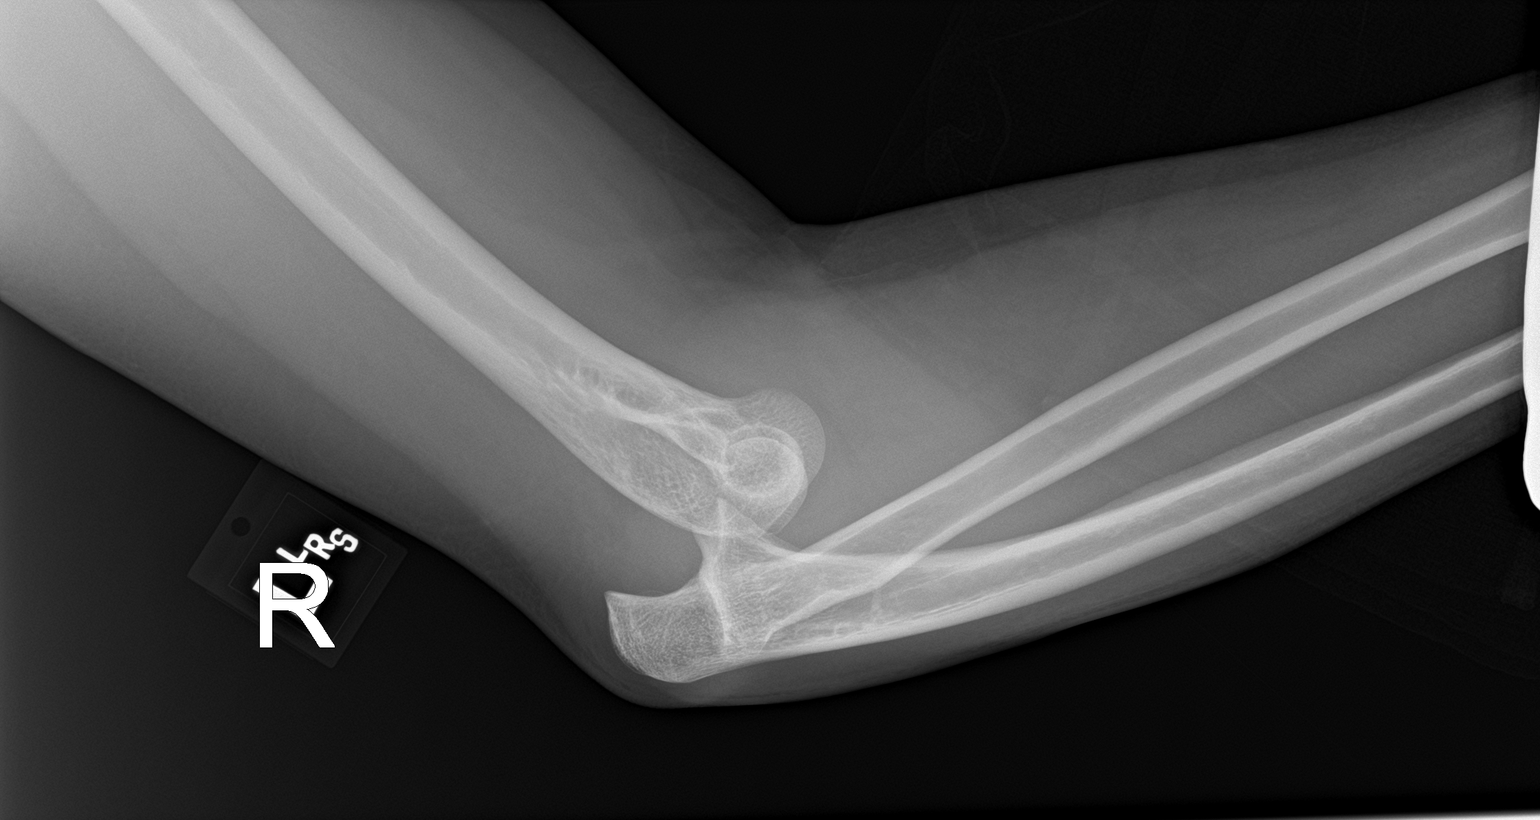

[3 of 3 positions shown; findings below may reference images not displayed]

FINDINGS: Posterior dislocation of the right elbow with complete posterior
displacement of the radius and ulna with respect to the humerus. No
fractures are demonstrated. Soft tissues are unremarkable.
IMPRESSION: Posterior dislocation of the right elbow.

## 2024-03-03 DEATH — deceased
